# Patient Record
Sex: Female | Born: 1960 | Race: White | Hispanic: No | Marital: Married | Smoking: Never smoker
Health system: Southern US, Community
[De-identification: ages and names within clinical notes are randomized; demographics above are authoritative.]

## PROBLEM LIST (undated history)

## (undated) DIAGNOSIS — I10 Essential (primary) hypertension: Secondary | ICD-10-CM

## (undated) DIAGNOSIS — J45909 Unspecified asthma, uncomplicated: Secondary | ICD-10-CM

## (undated) HISTORY — PX: TOTAL HIP ARTHROPLASTY: SHX124

## (undated) HISTORY — DX: Unspecified asthma, uncomplicated: J45.909

---

## 2008-01-04 ENCOUNTER — Encounter: Admission: RE | Admit: 2008-01-04 | Discharge: 2008-01-04 | Payer: Self-pay | Admitting: Unknown Physician Specialty

## 2008-01-08 ENCOUNTER — Encounter: Admission: RE | Admit: 2008-01-08 | Discharge: 2008-01-08 | Payer: Self-pay | Admitting: Unknown Physician Specialty

## 2008-05-11 ENCOUNTER — Ambulatory Visit: Payer: Self-pay | Admitting: Family Medicine

## 2008-05-11 DIAGNOSIS — B370 Candidal stomatitis: Secondary | ICD-10-CM | POA: Insufficient documentation

## 2008-07-28 ENCOUNTER — Ambulatory Visit: Payer: Self-pay | Admitting: Occupational Medicine

## 2008-07-28 LAB — CONVERTED CEMR LAB
Glucose, Urine, Semiquant: NEGATIVE
Nitrite: NEGATIVE
Protein, U semiquant: 30
Specific Gravity, Urine: 1.02
Urobilinogen, UA: 0.2

## 2008-09-28 ENCOUNTER — Ambulatory Visit: Payer: Self-pay | Admitting: Family Medicine

## 2008-09-28 DIAGNOSIS — J0100 Acute maxillary sinusitis, unspecified: Secondary | ICD-10-CM | POA: Insufficient documentation

## 2008-10-02 ENCOUNTER — Telehealth (INDEPENDENT_AMBULATORY_CARE_PROVIDER_SITE_OTHER): Payer: Self-pay | Admitting: *Deleted

## 2009-09-24 ENCOUNTER — Encounter: Admission: RE | Admit: 2009-09-24 | Discharge: 2009-09-24 | Payer: Self-pay | Admitting: Unknown Physician Specialty

## 2009-12-04 ENCOUNTER — Encounter: Admission: RE | Admit: 2009-12-04 | Discharge: 2009-12-04 | Payer: Self-pay | Admitting: Unknown Physician Specialty

## 2009-12-15 ENCOUNTER — Encounter: Admission: RE | Admit: 2009-12-15 | Discharge: 2009-12-15 | Payer: Self-pay | Admitting: Unknown Physician Specialty

## 2010-04-26 ENCOUNTER — Other Ambulatory Visit: Payer: Self-pay | Admitting: Unknown Physician Specialty

## 2010-04-26 ENCOUNTER — Ambulatory Visit
Admission: RE | Admit: 2010-04-26 | Discharge: 2010-04-26 | Disposition: A | Discharge status: Home / Self Care | Payer: BC Managed Care – PPO | Source: Ambulatory Visit | Attending: Unknown Physician Specialty | Admitting: Unknown Physician Specialty

## 2010-04-26 DIAGNOSIS — Z09 Encounter for follow-up examination after completed treatment for conditions other than malignant neoplasm: Secondary | ICD-10-CM

## 2011-01-26 ENCOUNTER — Other Ambulatory Visit: Payer: Self-pay | Admitting: Unknown Physician Specialty

## 2011-01-26 DIAGNOSIS — Z1231 Encounter for screening mammogram for malignant neoplasm of breast: Secondary | ICD-10-CM

## 2011-02-01 ENCOUNTER — Ambulatory Visit
Admission: RE | Admit: 2011-02-01 | Discharge: 2011-02-01 | Disposition: A | Discharge status: Home / Self Care | Payer: Self-pay | Source: Ambulatory Visit | Attending: Unknown Physician Specialty | Admitting: Unknown Physician Specialty

## 2011-02-01 DIAGNOSIS — Z1231 Encounter for screening mammogram for malignant neoplasm of breast: Secondary | ICD-10-CM

## 2012-02-27 ENCOUNTER — Other Ambulatory Visit: Payer: Self-pay | Admitting: Unknown Physician Specialty

## 2012-02-27 DIAGNOSIS — Z1231 Encounter for screening mammogram for malignant neoplasm of breast: Secondary | ICD-10-CM

## 2012-03-01 ENCOUNTER — Ambulatory Visit (INDEPENDENT_AMBULATORY_CARE_PROVIDER_SITE_OTHER): Payer: BC Managed Care – PPO

## 2012-03-01 DIAGNOSIS — Z1231 Encounter for screening mammogram for malignant neoplasm of breast: Secondary | ICD-10-CM

## 2012-10-26 ENCOUNTER — Other Ambulatory Visit: Payer: Self-pay | Admitting: Family Medicine

## 2012-10-26 DIAGNOSIS — N959 Unspecified menopausal and perimenopausal disorder: Secondary | ICD-10-CM

## 2012-11-26 ENCOUNTER — Other Ambulatory Visit: Payer: Self-pay | Admitting: Family Medicine

## 2012-11-26 DIAGNOSIS — N959 Unspecified menopausal and perimenopausal disorder: Secondary | ICD-10-CM

## 2012-12-04 ENCOUNTER — Other Ambulatory Visit: Payer: BC Managed Care – PPO

## 2012-12-18 ENCOUNTER — Ambulatory Visit (INDEPENDENT_AMBULATORY_CARE_PROVIDER_SITE_OTHER): Payer: BC Managed Care – PPO

## 2012-12-18 DIAGNOSIS — Z13820 Encounter for screening for osteoporosis: Secondary | ICD-10-CM

## 2016-05-09 ENCOUNTER — Other Ambulatory Visit: Payer: Self-pay | Admitting: Unknown Physician Specialty

## 2016-05-09 DIAGNOSIS — Z780 Asymptomatic menopausal state: Secondary | ICD-10-CM

## 2016-05-09 DIAGNOSIS — Z1231 Encounter for screening mammogram for malignant neoplasm of breast: Secondary | ICD-10-CM

## 2017-05-15 ENCOUNTER — Ambulatory Visit (INDEPENDENT_AMBULATORY_CARE_PROVIDER_SITE_OTHER): Payer: Managed Care, Other (non HMO)

## 2017-05-15 ENCOUNTER — Other Ambulatory Visit: Payer: Self-pay | Admitting: Unknown Physician Specialty

## 2017-05-15 DIAGNOSIS — R229 Localized swelling, mass and lump, unspecified: Secondary | ICD-10-CM

## 2017-05-15 DIAGNOSIS — R220 Localized swelling, mass and lump, head: Secondary | ICD-10-CM | POA: Diagnosis not present

## 2017-05-15 DIAGNOSIS — Z780 Asymptomatic menopausal state: Secondary | ICD-10-CM

## 2017-05-15 DIAGNOSIS — Z1231 Encounter for screening mammogram for malignant neoplasm of breast: Secondary | ICD-10-CM

## 2017-05-17 ENCOUNTER — Other Ambulatory Visit: Payer: Managed Care, Other (non HMO)

## 2017-05-17 ENCOUNTER — Ambulatory Visit: Payer: Self-pay

## 2017-05-31 ENCOUNTER — Other Ambulatory Visit: Payer: Managed Care, Other (non HMO)

## 2017-05-31 ENCOUNTER — Ambulatory Visit: Payer: Self-pay

## 2017-06-07 ENCOUNTER — Ambulatory Visit (INDEPENDENT_AMBULATORY_CARE_PROVIDER_SITE_OTHER): Payer: Managed Care, Other (non HMO)

## 2017-06-07 DIAGNOSIS — Z780 Asymptomatic menopausal state: Secondary | ICD-10-CM

## 2017-06-07 DIAGNOSIS — Z13820 Encounter for screening for osteoporosis: Secondary | ICD-10-CM

## 2017-06-07 DIAGNOSIS — Z1231 Encounter for screening mammogram for malignant neoplasm of breast: Secondary | ICD-10-CM | POA: Diagnosis not present

## 2018-08-29 ENCOUNTER — Other Ambulatory Visit: Payer: Self-pay | Admitting: Unknown Physician Specialty

## 2018-08-29 DIAGNOSIS — Z1231 Encounter for screening mammogram for malignant neoplasm of breast: Secondary | ICD-10-CM

## 2018-10-04 ENCOUNTER — Ambulatory Visit (INDEPENDENT_AMBULATORY_CARE_PROVIDER_SITE_OTHER): Payer: Managed Care, Other (non HMO)

## 2018-10-04 ENCOUNTER — Other Ambulatory Visit: Payer: Self-pay

## 2018-10-04 DIAGNOSIS — Z1231 Encounter for screening mammogram for malignant neoplasm of breast: Secondary | ICD-10-CM

## 2019-08-29 ENCOUNTER — Other Ambulatory Visit: Payer: Self-pay | Admitting: Unknown Physician Specialty

## 2019-08-29 DIAGNOSIS — Z780 Asymptomatic menopausal state: Secondary | ICD-10-CM

## 2019-08-29 DIAGNOSIS — Z1239 Encounter for other screening for malignant neoplasm of breast: Secondary | ICD-10-CM

## 2019-10-30 ENCOUNTER — Ambulatory Visit (INDEPENDENT_AMBULATORY_CARE_PROVIDER_SITE_OTHER): Payer: Managed Care, Other (non HMO)

## 2019-10-30 ENCOUNTER — Other Ambulatory Visit: Payer: Self-pay

## 2019-10-30 DIAGNOSIS — Z1239 Encounter for other screening for malignant neoplasm of breast: Secondary | ICD-10-CM | POA: Diagnosis not present

## 2019-10-30 DIAGNOSIS — Z780 Asymptomatic menopausal state: Secondary | ICD-10-CM

## 2020-10-30 ENCOUNTER — Other Ambulatory Visit: Payer: Self-pay

## 2020-10-30 ENCOUNTER — Emergency Department (INDEPENDENT_AMBULATORY_CARE_PROVIDER_SITE_OTHER)
Admission: EM | Admit: 2020-10-30 | Discharge: 2020-10-30 | Disposition: A | Discharge status: Home / Self Care | Payer: 59 | Source: Home / Self Care

## 2020-10-30 DIAGNOSIS — S61219A Laceration without foreign body of unspecified finger without damage to nail, initial encounter: Secondary | ICD-10-CM

## 2020-10-30 DIAGNOSIS — S61212A Laceration without foreign body of right middle finger without damage to nail, initial encounter: Secondary | ICD-10-CM

## 2020-10-30 DIAGNOSIS — Z23 Encounter for immunization: Secondary | ICD-10-CM | POA: Diagnosis not present

## 2020-10-30 HISTORY — DX: Essential (primary) hypertension: I10

## 2020-10-30 MED ORDER — TETANUS-DIPHTH-ACELL PERTUSSIS 5-2.5-18.5 LF-MCG/0.5 IM SUSY
0.5000 mL | PREFILLED_SYRINGE | Freq: Once | INTRAMUSCULAR | Status: AC
  Administered 2020-10-30: 0.5 mL via INTRAMUSCULAR

## 2020-10-30 MED ORDER — CEFDINIR 300 MG PO CAPS: 300.0000 mg | ORAL_CAPSULE | Freq: Two times a day (BID) | ORAL | Status: DC

## 2020-10-30 NOTE — Discharge Instructions (Addendum)
Advised patient to take medication as directed with food to completion.  Encourage patient decrease daily water intake while taking this medication.  Advised/instructed patient to leave affected area of right middle finger covered for tonight and tomorrow afternoon.  Advised tomorrow late afternoon may remove dressing and leave wound open to air and to please follow-up with me on Sunday, 11/01/2020 for wound check.

## 2020-10-30 NOTE — ED Provider Notes (Signed)
[facility]    CSN: [id number] Arrival date & time: 10/30/20  1915      History   Chief Complaint Chief Complaint  Patient presents with   finger laceration    Rt middle    HPI Kelsey Bond is a 60 y.o. female.   HPI 60 year old female presents with right middle finger skin avulsion after using mandolin at roughly 6 PM.  Patient reports is due for her tetanus.  Past Medical History:  Diagnosis Date   Hypertension     Patient Active Problem List   Diagnosis Date Noted   ACUTE MAXILLARY SINUSITIS 09/28/2008   CANDIDIASIS, ORAL 05/11/2008    Past Surgical History:  Procedure Laterality Date   TOTAL HIP ARTHROPLASTY      OB History   No obstetric history on file.      Home Medications    Prior to Admission medications   Medication Sig Start Date End Date Taking? Authorizing Provider  fexofenadine (ALLEGRA) 60 MG tablet Take 60 mg by mouth 2 (two) times daily.   Yes [provider]  Hydroxychloroquine Sulfate (PLAQUENIL PO) Take by mouth.   Yes [provider]  montelukast (SINGULAIR) 10 MG tablet Take 10 mg by mouth at bedtime.   Yes [provider]  Probiotic Product (PROBIOTIC-10 PO) Take by mouth.   Yes [provider]    Family History Family History  Problem Relation Age of Onset   Healthy Mother     Social History Social History   Tobacco Use   Smoking status: Never   Smokeless tobacco: Never  Vaping Use   Vaping Use: Never used  Substance Use Topics   Alcohol use: Never   Drug use: Never     Allergies   Clarithromycin and Other   Review of Systems Review of Systems  Skin:  Positive for wound.    Physical Exam Triage Vital Signs ED Triage Vitals  Enc Vitals Group     BP 10/30/20 1933 (!) 146/84     Pulse Rate 10/30/20 1933 93     Resp 10/30/20 1933 12     Temp 10/30/20 1933 98.6 F (37 C)     Temp Source 10/30/20 1933 Oral     SpO2 10/30/20 1933 97 %     Weight 10/30/20 1925  139 lb (63 kg)     Height 10/30/20 1925 5\' 3"  (1.6 m)     Head Circumference --      Peak Flow --      Pain Score 10/30/20 1925 1     Pain Loc --      Pain Edu? --      Excl. in GC? --    No data found.  Updated Vital Signs BP (!) 146/84 (BP Location: Left Arm)   Pulse 93   Temp 98.6 F (37 C) (Oral)   Resp 12   Ht 5\' 3"  (1.6 m)   Wt 139 lb (63 kg)   SpO2 97%   BMI 24.62 kg/m      Physical Exam Vitals and nursing note reviewed.  Constitutional:      Appearance: Normal appearance. She is normal weight.  HENT:     Head: Normocephalic and atraumatic.     Mouth/Throat:     Mouth: Mucous membranes are moist.     Pharynx: Oropharynx is clear.  Eyes:     Extraocular Movements: Extraocular movements intact.     Conjunctiva/sclera: Conjunctivae normal.     Pupils: Pupils  are equal, round, and reactive to light.  Cardiovascular:     Rate and Rhythm: Normal rate and regular rhythm.     Pulses: Normal pulses.     Heart sounds: Normal heart sounds.  Pulmonary:     Effort: Pulmonary effort is normal.     Breath sounds: Normal breath sounds. No wheezing, rhonchi or rales.  Musculoskeletal:        General: Normal range of motion.     Cervical back: Normal range of motion and neck supple.  Lymphadenopathy:     Cervical: No cervical adenopathy.  Skin:    General: Skin is warm and dry.     Comments: Right hand middle finger (palmar surface of DIP): ~0.5 cm triangular-shaped avulsed skin, noted with moderate bleeding; after 15 minutes of pressure dressing, homeostasis not achieved, silver nitrate strips (x 3) used to stop bleeding, homeostasis achieved, neurosensory intact, neurovascular intact, brisk cap refill, wound bandage placed prior to discharge.  Neurological:     General: No focal deficit present.     Mental Status: She is alert and oriented to person, place, and time. Mental status is at baseline.  Psychiatric:        Mood and Affect: Mood normal.        Behavior:  Behavior normal.        Thought Content: Thought content normal.     UC Treatments / Results  Labs (all labs ordered are listed, but only abnormal results are displayed) Labs Reviewed - No data to display  EKG   Radiology No results found.  Procedures Procedures (including critical care time)  Medications Ordered in UC Medications  cefdinir (OMNICEF) capsule 300 mg (has no administration in time range)  Tdap (BOOSTRIX) injection 0.5 mL (0.5 mLs Intramuscular Given 10/30/20 1941)    Initial Impression / Assessment and Plan / UC Course  I have reviewed the triage vital signs and the nursing notes.  Pertinent labs & imaging results that were available during my care of the patient were reviewed by me and considered in my medical decision making (see chart for details).     MDM: Right middle finger laceration, initial encounter-Rx'd cefdinir, wound dressing applied, advised patient to follow-up with me with wound check on Sunday, 11/01/2020. Advised patient to take medication as directed with food to completion.  Encourage patient decrease daily water intake while taking this medication.  Advised/instructed patient to leave affected area of right middle finger covered for tonight and tomorrow afternoon.  Advised tomorrow late afternoon may remove dressing and leave wound open to air and to please follow-up with me on Sunday, 11/01/2020 for wound check.  Patient discharged home, hemodynamically stable. Final Clinical Impressions(s) / UC Diagnoses   Final diagnoses:  Finger laceration, initial encounter     Discharge Instructions      Advised patient to take medication as directed with food to completion.  Encourage patient decrease daily water intake while taking this medication.  Advised/instructed patient to leave affected area of right middle finger covered for tonight and tomorrow afternoon.  Advised tomorrow late afternoon may remove dressing and leave wound open to air and  to please follow-up with me on Sunday, 11/01/2020 for wound check.     ED Prescriptions   None    PDMP not reviewed this encounter.   Trevor Iha, FNP 10/30/20 2035

## 2020-10-30 NOTE — ED Triage Notes (Signed)
Pt presents with rt middle finger laceration after using mandolin. Pt states she is due for her tetanus.

## 2020-10-31 ENCOUNTER — Telehealth: Payer: Self-pay

## 2020-10-31 MED ORDER — FLUCONAZOLE 150 MG PO TABS: ORAL_TABLET | ORAL | 0 refills | Status: DC

## 2020-10-31 MED ORDER — CEFDINIR 300 MG PO CAPS
300.0000 mg | ORAL_CAPSULE | Freq: Two times a day (BID) | ORAL | 0 refills | Status: DC
Start: 2020-10-31 — End: 2021-12-27

## 2020-10-31 NOTE — Telephone Encounter (Signed)
Pts rx never went to pharmacy. After rx verification with provider, cefdinir and diflucan sent to pharmacy via escribe.

## 2021-03-09 IMAGING — MG DIGITAL SCREENING BILAT W/ TOMO W/ CAD
8 series · 8 of 24 positions shown · non-contrast
Comparison: Previous exam(s).

CLINICAL DATA: Screening.

EXAM:
DIGITAL SCREENING BILATERAL MAMMOGRAM WITH TOMO AND CAD

[R MLO synth-2D]
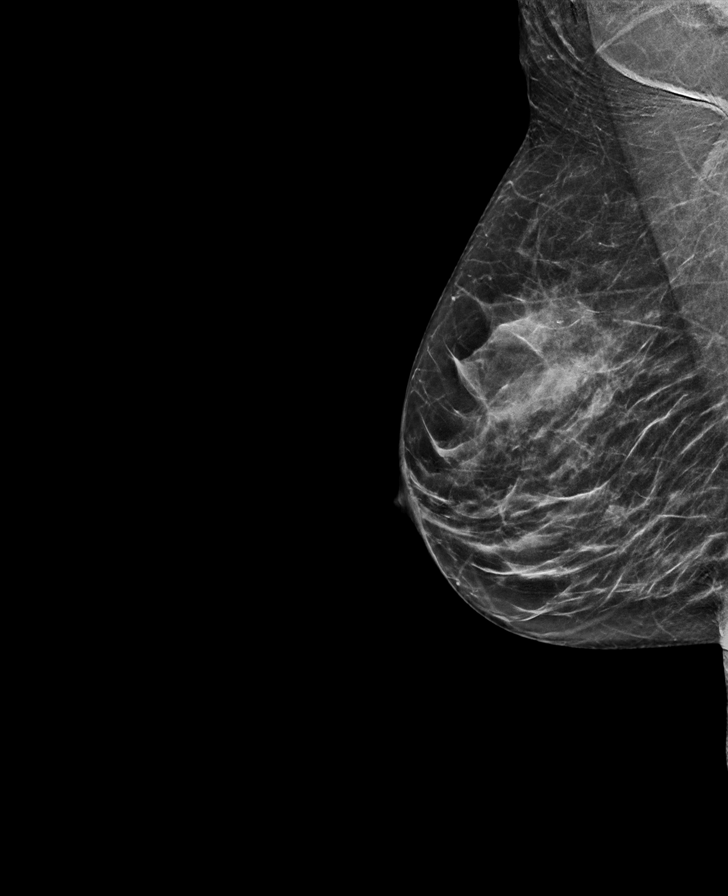

[L CC synth-2D]
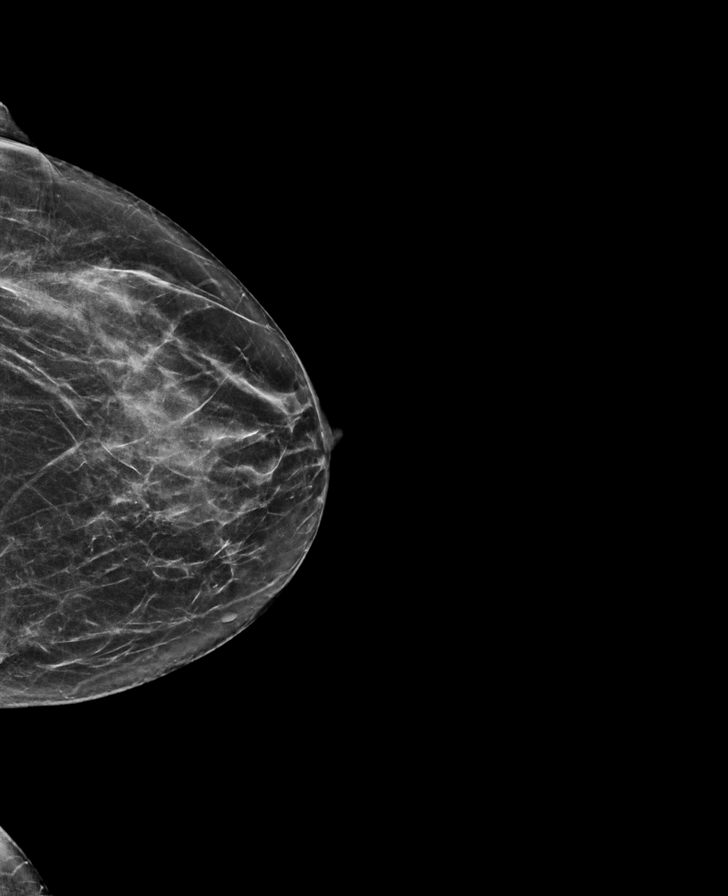

[R CC synth-2D]
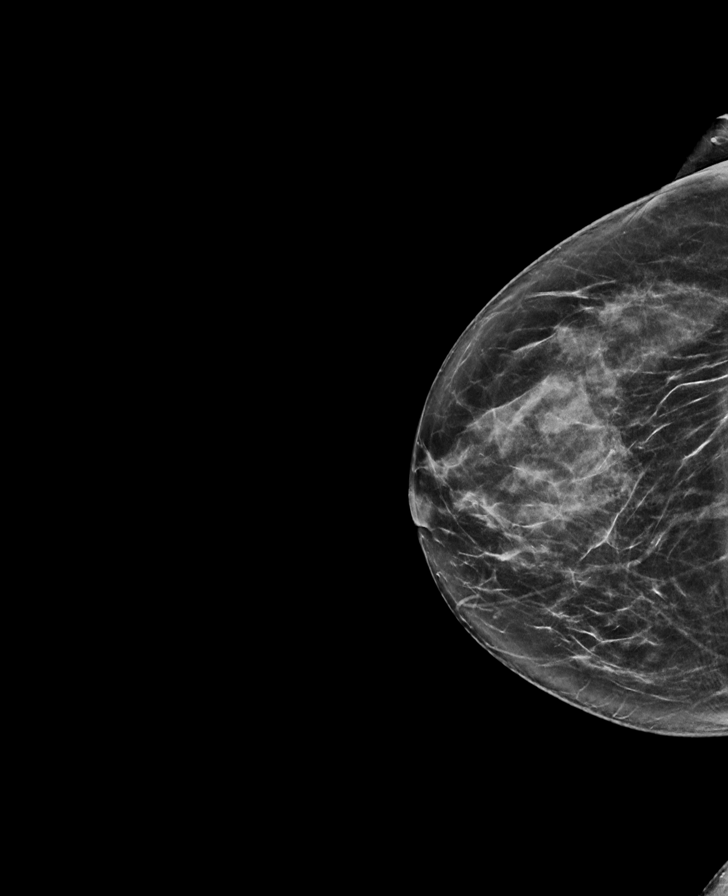

[L MLO synth-2D]
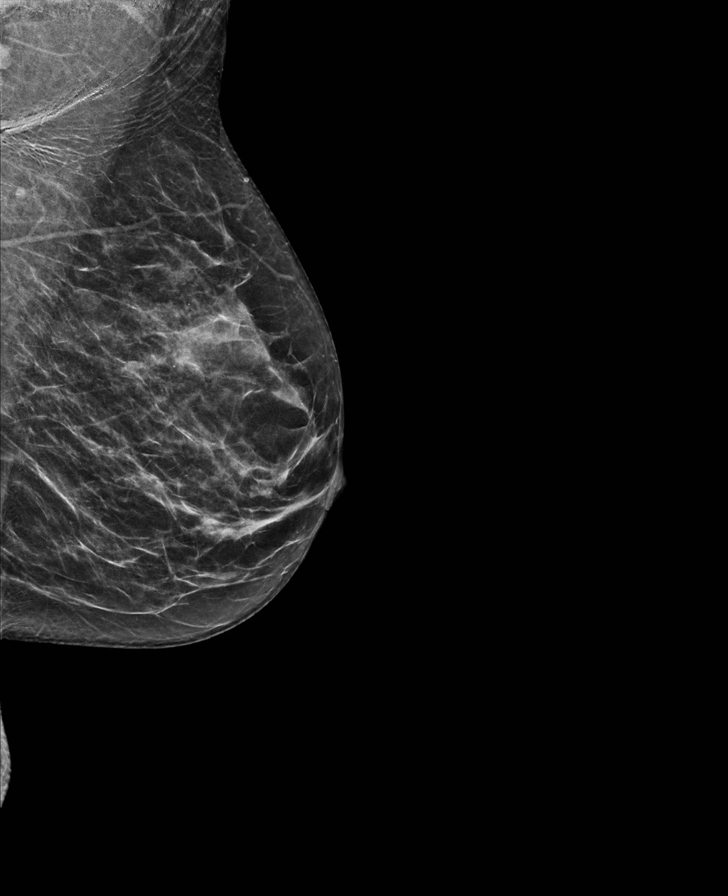

[R MLO tomo · tomo slice 31/62.0]
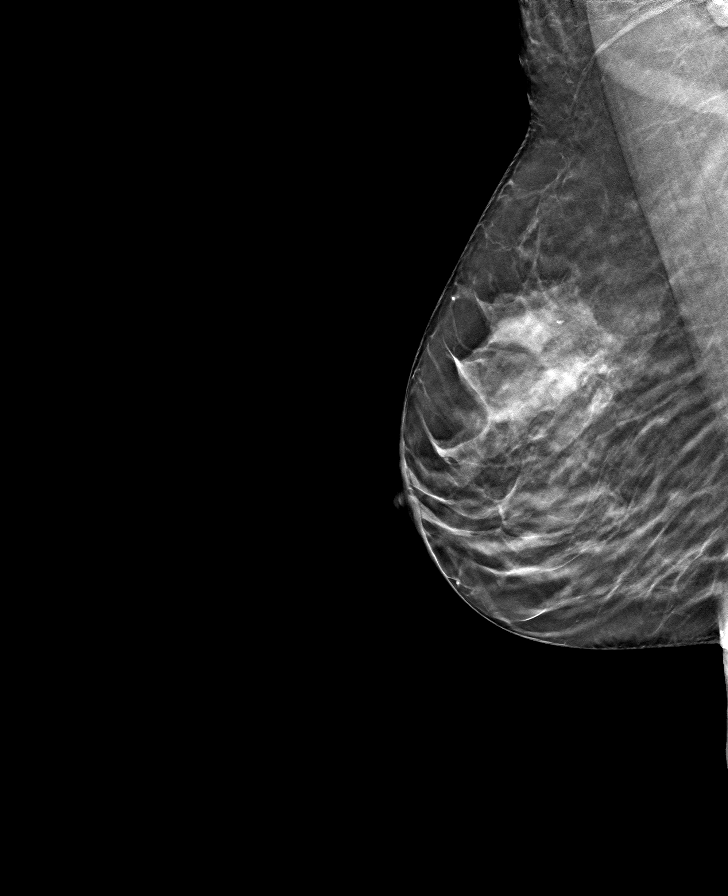

[L CC tomo · tomo slice 31/60.0]
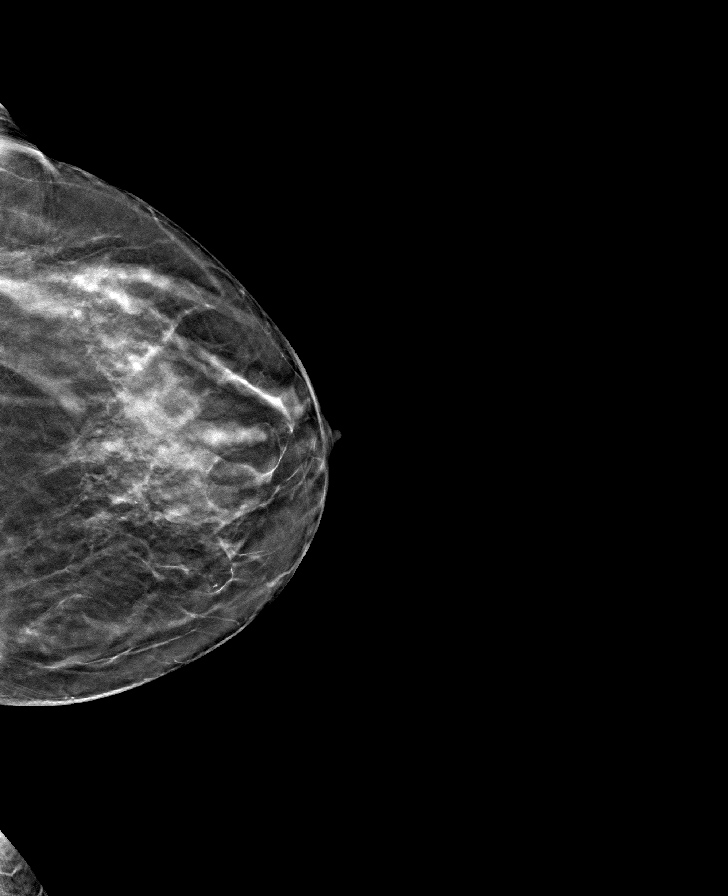

[L MLO tomo · tomo slice 31/60.0]
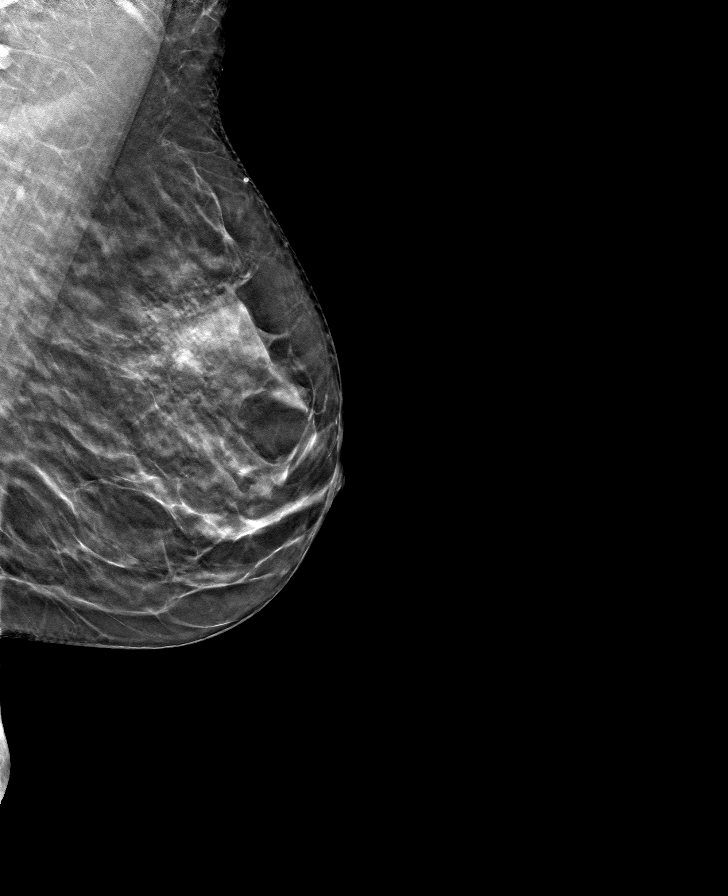

[R CC tomo · tomo slice 31/61.0]
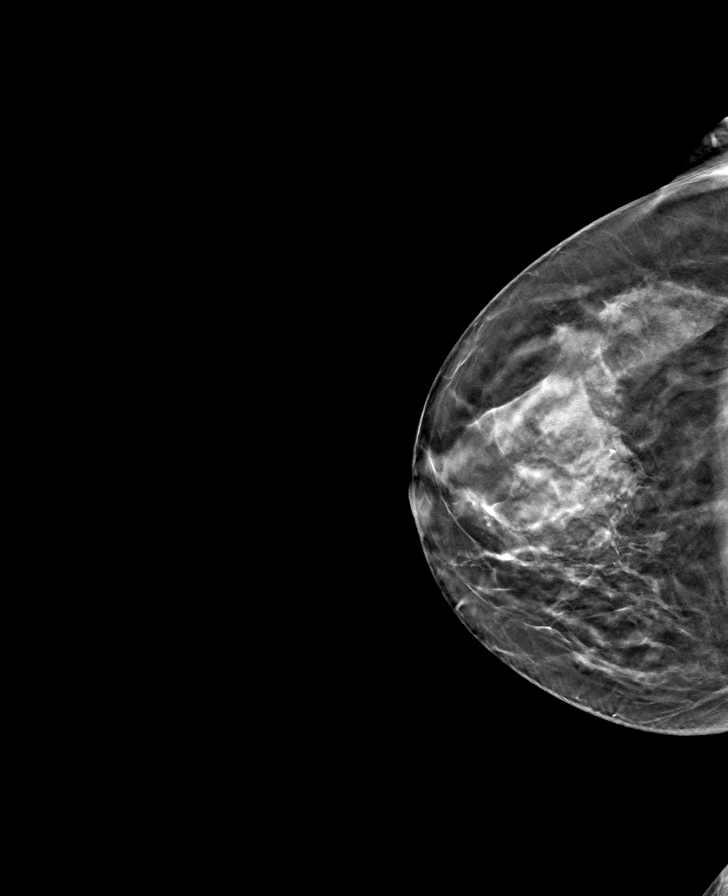

[8 of 24 positions shown; findings below may reference images not displayed]

ACR Breast Density Category c: The breast tissue is heterogeneously
dense, which may obscure small masses.
FINDINGS: There are no findings suspicious for malignancy. Images were
processed with CAD.
IMPRESSION: No mammographic evidence of malignancy. A result letter of this
screening mammogram will be mailed directly to the patient.

RECOMMENDATION:
Screening mammogram in one year. (Code:[id number]-U-LHB)

BI-RADS CATEGORY  1: Negative.

## 2021-06-21 ENCOUNTER — Other Ambulatory Visit: Payer: Self-pay | Admitting: Unknown Physician Specialty

## 2021-06-21 DIAGNOSIS — Z1231 Encounter for screening mammogram for malignant neoplasm of breast: Secondary | ICD-10-CM

## 2021-12-27 ENCOUNTER — Ambulatory Visit
Admission: EM | Admit: 2021-12-27 | Discharge: 2021-12-27 | Disposition: A | Discharge status: Home / Self Care | Payer: 59 | Attending: Family Medicine | Admitting: Family Medicine

## 2021-12-27 DIAGNOSIS — J4521 Mild intermittent asthma with (acute) exacerbation: Secondary | ICD-10-CM

## 2021-12-27 DIAGNOSIS — J069 Acute upper respiratory infection, unspecified: Secondary | ICD-10-CM

## 2021-12-27 DIAGNOSIS — H9201 Otalgia, right ear: Secondary | ICD-10-CM

## 2021-12-27 MED ORDER — AMOXICILLIN-POT CLAVULANATE 875-125 MG PO TABS: 1.0000 | ORAL_TABLET | Freq: Two times a day (BID) | ORAL | 0 refills | Status: AC

## 2021-12-27 MED ORDER — FLUCONAZOLE 150 MG PO TABS: 150.0000 mg | ORAL_TABLET | Freq: Every day | ORAL | 0 refills | Status: AC

## 2021-12-27 MED ORDER — HYDROCOD POLI-CHLORPHE POLI ER 10-8 MG/5ML PO SUER: 5.0000 mL | Freq: Two times a day (BID) | ORAL | 0 refills | Status: AC | PRN

## 2021-12-27 NOTE — Discharge Instructions (Signed)
Make sure you are drinking plenty of liquids May continue your Mucinex Take antibiotic 2 times a day for 7 days I have given you Diflucan to take if needed I have prescribed Tussionex to use for the cough. Get plenty of rest See your doctor if not improving by next week

## 2021-12-27 NOTE — ED Triage Notes (Signed)
Pt presents with c/o cough, fever, SOB, rt ear fullness, and congestion that began greater than 1.5 weeks ago after exposure to viral illness.

## 2021-12-27 NOTE — ED Provider Notes (Signed)
[facility]    CSN: [id number] Arrival date & time: 12/27/21  1021      History   Chief Complaint Chief Complaint  Patient presents with   Fever   Cough   Shortness of Breath    HPI Kelsey Bond is a 61 y.o. female.   HPI  Patient states that a virus has been going through the family.  It is not COVID or flu.  She states that it is flulike symptoms with fever, body aches, runny nose cough shortness of breath head congestion.  She also has a history of asthma and states that she is having periods of shortness of breath.  She states she feels that she cannot lay down to sleep at night.  She has a decreased appetite and states that she has lost 6 pounds.  She is trying to drink enough fluids.  Her chest hurts from the coughing Patient has Sjogren's syndrome and takes Plaquenil  Past Medical History:  Diagnosis Date   Asthma    Hypertension     Patient Active Problem List   Diagnosis Date Noted   ACUTE MAXILLARY SINUSITIS 09/28/2008   CANDIDIASIS, ORAL 05/11/2008    Past Surgical History:  Procedure Laterality Date   TOTAL HIP ARTHROPLASTY      OB History   No obstetric history on file.      Home Medications    Prior to Admission medications   Medication Sig Start Date End Date Taking? Authorizing Provider  albuterol (VENTOLIN HFA) 108 (90 Base) MCG/ACT inhaler Inhale into the lungs every 6 (six) hours as needed for wheezing or shortness of breath.   Yes [provider]  amLODipine (NORVASC) 2.5 MG tablet Take 2.5 mg by mouth daily.   Yes [provider]  amoxicillin-clavulanate (AUGMENTIN) 875-125 MG tablet Take 1 tablet by mouth every 12 (twelve) hours. 12/27/21  Yes Eustace Moore, MD  chlorpheniramine-HYDROcodone (TUSSIONEX) 10-8 MG/5ML Take 5 mLs by mouth every 12 (twelve) hours as needed for cough. 12/27/21  Yes Eustace Moore, MD  fluconazole (DIFLUCAN) 150 MG tablet Take 1 tablet (150 mg total) by mouth daily. Repeat in  1 week if needed 12/27/21  Yes Eustace Moore, MD  lisdexamfetamine (VYVANSE) 20 MG capsule Take 20 mg by mouth daily.   Yes [provider]  Zinc Sulfate (ZINC 15) 66 MG TABS Take by mouth.   Yes [provider]  fexofenadine (ALLEGRA) 60 MG tablet Take 60 mg by mouth 2 (two) times daily.    [provider]  Hydroxychloroquine Sulfate (PLAQUENIL PO) Take by mouth.    [provider]  montelukast (SINGULAIR) 10 MG tablet Take 10 mg by mouth at bedtime.    [provider]  Probiotic Product (PROBIOTIC-10 PO) Take by mouth.    [provider]    Family History Family History  Problem Relation Age of Onset   Healthy Mother     Social History Social History   Tobacco Use   Smoking status: Never   Smokeless tobacco: Never  Vaping Use   Vaping Use: Never used  Substance Use Topics   Alcohol use: Never   Drug use: Never     Allergies   Clarithromycin and Other   Review of Systems Review of Systems See HPI  Physical Exam Triage Vital Signs ED Triage Vitals  Enc Vitals Group     BP 12/27/21 1033 134/85     Pulse Rate 12/27/21 1033 (!) 101  Resp 12/27/21 1033 16     Temp 12/27/21 1033 98.4 F (36.9 C)     Temp Source 12/27/21 1033 Oral     SpO2 12/27/21 1033 99 %     Weight --      Height --      Head Circumference --      Peak Flow --      Pain Score 12/27/21 1030 0     Pain Loc --      Pain Edu? --      Excl. in GC? --    No data found.  Updated Vital Signs BP 134/85 (BP Location: Right Arm)   Pulse (!) 101   Temp 98.4 F (36.9 C) (Oral)   Resp 16   SpO2 99%      Physical Exam Constitutional:      General: She is not in acute distress.    Appearance: She is well-developed. She is ill-appearing.     Comments: Tearful  HENT:     Head: Normocephalic and atraumatic.     Right Ear: Tympanic membrane and ear canal normal.     Left Ear: Tympanic membrane and ear canal normal.     Nose: Congestion  and rhinorrhea present.     Mouth/Throat:     Pharynx: Posterior oropharyngeal erythema present.  Eyes:     Extraocular Movements: Extraocular movements intact.     Conjunctiva/sclera: Conjunctivae normal.     Pupils: Pupils are equal, round, and reactive to light.  Cardiovascular:     Rate and Rhythm: Normal rate and regular rhythm.     Heart sounds: Normal heart sounds.  Pulmonary:     Effort: Pulmonary effort is normal. No respiratory distress.     Breath sounds: Normal breath sounds. No wheezing or rhonchi.  Abdominal:     General: There is no distension.     Palpations: Abdomen is soft.  Musculoskeletal:        General: Normal range of motion.     Cervical back: Normal range of motion.  Lymphadenopathy:     Cervical: Cervical adenopathy present.  Skin:    General: Skin is warm and dry.  Neurological:     Mental Status: She is alert.  Psychiatric:        Mood and Affect: Mood normal.        Behavior: Behavior normal.      UC Treatments / Results  Labs (all labs ordered are listed, but only abnormal results are displayed) Labs Reviewed - No data to display  EKG   Radiology No results found.  Procedures Procedures (including critical care time)  Medications Ordered in UC Medications - No data to display  Initial Impression / Assessment and Plan / UC Course  I have reviewed the triage vital signs and the nursing notes.  Pertinent labs & imaging results that were available during my care of the patient were reviewed by me and considered in my medical decision making (see chart for details).     Final Clinical Impressions(s) / UC Diagnoses   Final diagnoses:  Mild intermittent asthma with acute exacerbation  Upper respiratory tract infection, unspecified type  Otalgia, right ear     Discharge Instructions      Make sure you are drinking plenty of liquids May continue your Mucinex Take antibiotic 2 times a day for 7 days I have given you Diflucan  to take if needed I have prescribed Tussionex to use for the cough. Get plenty of  rest See your doctor if not improving by next week    ED Prescriptions     Medication Sig Dispense Auth. Provider   amoxicillin-clavulanate (AUGMENTIN) 875-125 MG tablet Take 1 tablet by mouth every 12 (twelve) hours. 14 tablet Raylene Everts, MD   fluconazole (DIFLUCAN) 150 MG tablet Take 1 tablet (150 mg total) by mouth daily. Repeat in 1 week if needed 4 tablet Raylene Everts, MD   chlorpheniramine-HYDROcodone (TUSSIONEX) 10-8 MG/5ML Take 5 mLs by mouth every 12 (twelve) hours as needed for cough. 115 mL Raylene Everts, MD      I have reviewed the PDMP during this encounter.   Raylene Everts, MD 12/27/21 [phone]

## 2023-06-12 ENCOUNTER — Other Ambulatory Visit: Payer: Self-pay | Admitting: Family Medicine

## 2023-06-12 DIAGNOSIS — Z1231 Encounter for screening mammogram for malignant neoplasm of breast: Secondary | ICD-10-CM

## 2023-06-29 ENCOUNTER — Encounter (HOSPITAL_COMMUNITY): Payer: Self-pay

## 2023-07-06 ENCOUNTER — Ambulatory Visit

## 2023-07-06 DIAGNOSIS — Z1231 Encounter for screening mammogram for malignant neoplasm of breast: Secondary | ICD-10-CM
